# Patient Record
Sex: Female | Born: 1990 | Race: White | Hispanic: No | Marital: Single
Health system: Midwestern US, Community
[De-identification: ages and names within clinical notes are randomized; demographics above are authoritative.]

## PROBLEM LIST (undated history)

## (undated) DIAGNOSIS — Z113 Encounter for screening for infections with a predominantly sexual mode of transmission: Secondary | ICD-10-CM

---

## 2010-08-14 ENCOUNTER — Emergency Department (HOSPITAL_COMMUNITY)
Admission: EM | Admit: 2010-08-14 | Discharge: 2010-08-14 | Disposition: A | Discharge status: Home / Self Care | Payer: 59 | Attending: Emergency Medicine | Admitting: Emergency Medicine

## 2010-08-14 ENCOUNTER — Emergency Department (HOSPITAL_COMMUNITY): Payer: 59

## 2010-08-14 DIAGNOSIS — X500XXA Overexertion from strenuous movement or load, initial encounter: Secondary | ICD-10-CM | POA: Insufficient documentation

## 2010-08-14 DIAGNOSIS — S93409A Sprain of unspecified ligament of unspecified ankle, initial encounter: Secondary | ICD-10-CM | POA: Insufficient documentation

## 2010-08-14 DIAGNOSIS — Y92009 Unspecified place in unspecified non-institutional (private) residence as the place of occurrence of the external cause: Secondary | ICD-10-CM | POA: Insufficient documentation

## 2010-08-14 DIAGNOSIS — M25579 Pain in unspecified ankle and joints of unspecified foot: Secondary | ICD-10-CM | POA: Insufficient documentation

## 2012-01-29 IMAGING — CR DG ANKLE COMPLETE 3+V*R*
3 series · 3 of 3 positions shown · non-contrast
Comparison: None

CLINICAL DATA: Trauma.  Pain.

RIGHT ANKLE - COMPLETE 3+ VIEW

[t ankle joint ap right]
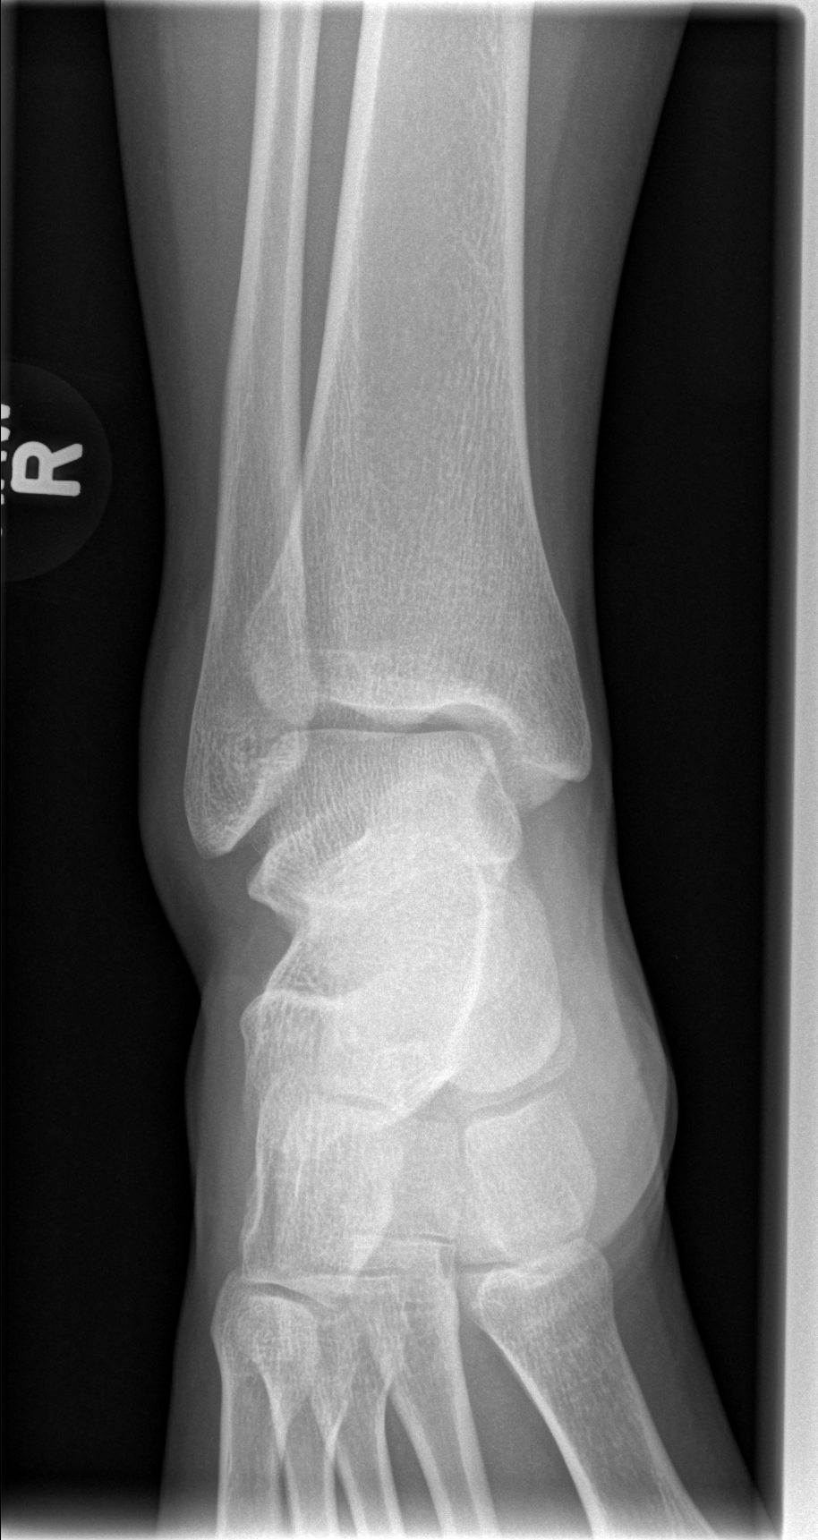

[t ankle joint oblique right]
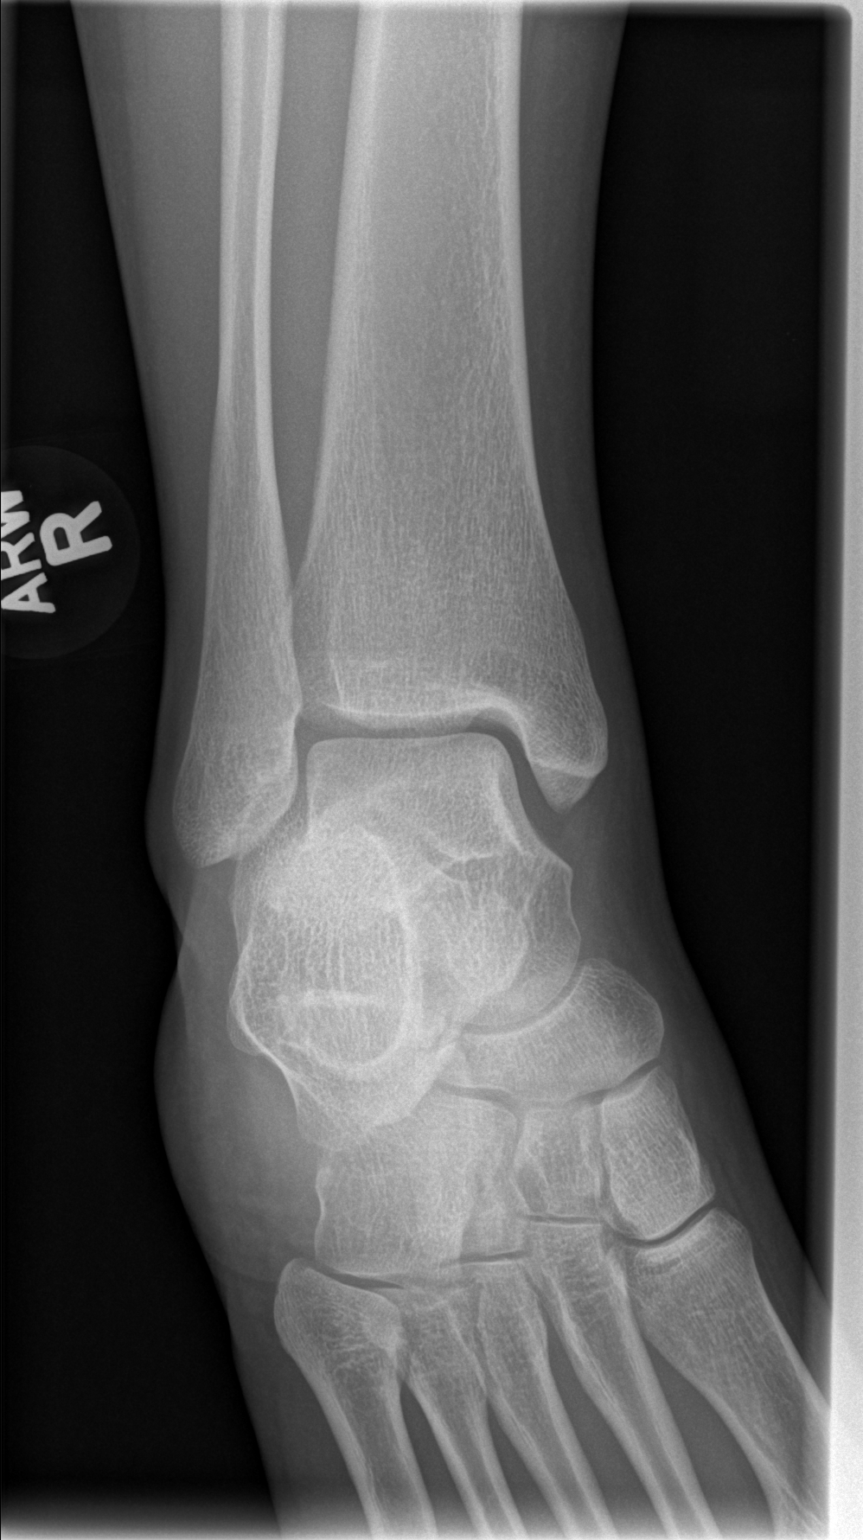

[t ankle joint lat right]
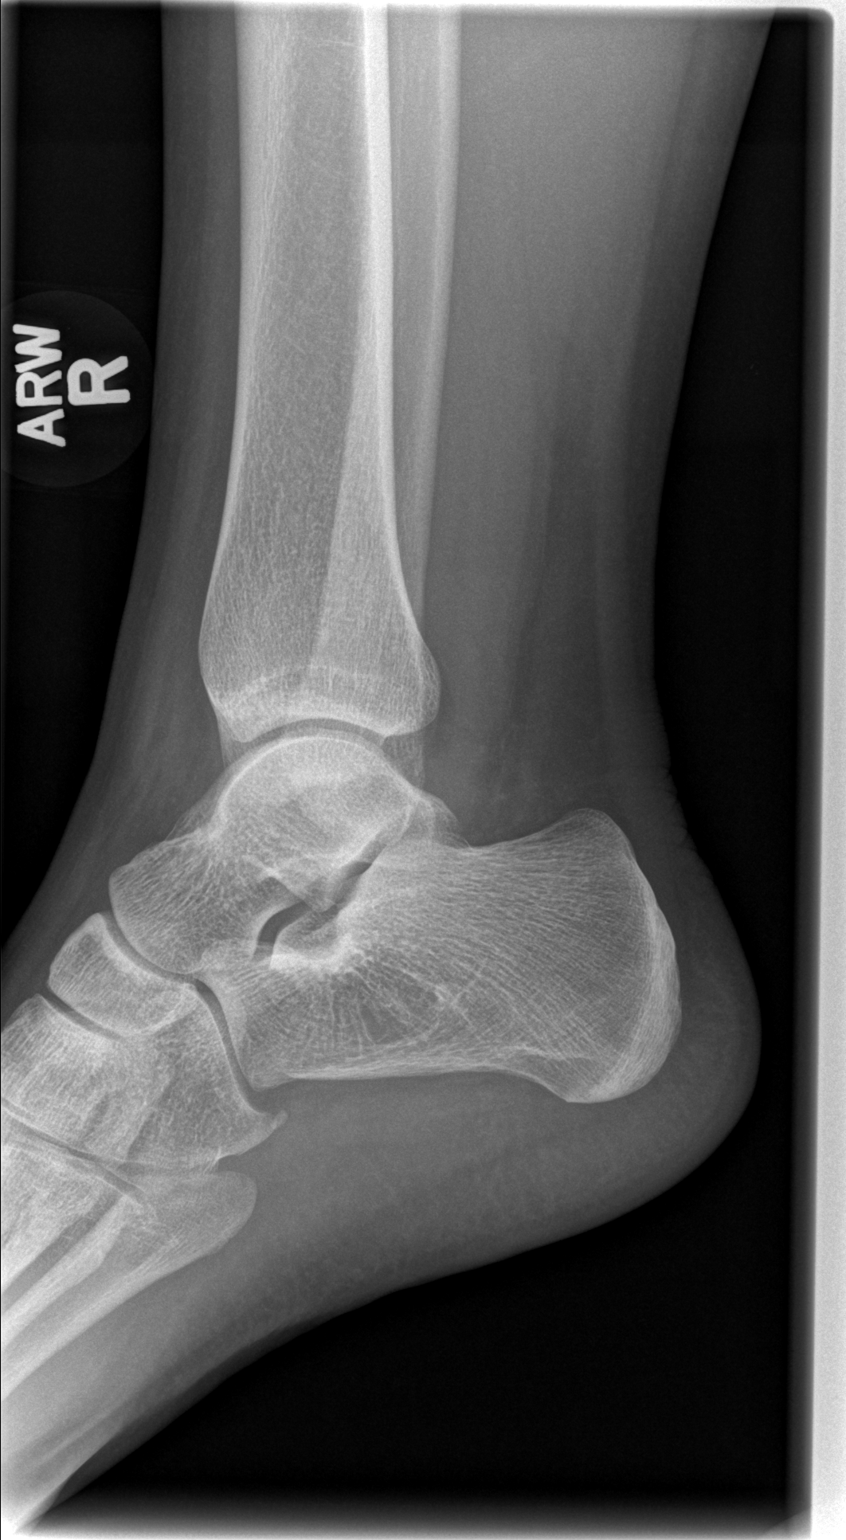

[3 of 3 positions shown; findings below may reference images not displayed]

FINDINGS: There is moderate soft tissue swelling overlying the
lateral ankle.

No underlying fractures or dislocations are identified.

There are no radio-opaque foreign bodies or soft tissue
calcifications identified.
IMPRESSION: 1.  No acute bony abnormalities.
2.  Lateral soft tissue swelling.

## 2015-01-13 ENCOUNTER — Other Ambulatory Visit: Payer: Self-pay

## 2015-01-13 MED ORDER — OMALIZUMAB 150 MG ~~LOC~~ SOLR: 300.0000 mg | SUBCUTANEOUS | Status: AC

## 2015-01-14 DIAGNOSIS — J3089 Other allergic rhinitis: Secondary | ICD-10-CM

## 2015-01-14 DIAGNOSIS — H1010 Acute atopic conjunctivitis, unspecified eye: Secondary | ICD-10-CM

## 2015-01-14 DIAGNOSIS — L508 Other urticaria: Secondary | ICD-10-CM | POA: Insufficient documentation

## 2015-01-14 DIAGNOSIS — J302 Other seasonal allergic rhinitis: Secondary | ICD-10-CM

## 2015-01-14 DIAGNOSIS — Z91018 Allergy to other foods: Secondary | ICD-10-CM

## 2015-01-14 DIAGNOSIS — Z87898 Personal history of other specified conditions: Secondary | ICD-10-CM | POA: Insufficient documentation

## 2015-02-02 ENCOUNTER — Ambulatory Visit (INDEPENDENT_AMBULATORY_CARE_PROVIDER_SITE_OTHER): Payer: BLUE CROSS/BLUE SHIELD

## 2015-02-02 DIAGNOSIS — J309 Allergic rhinitis, unspecified: Secondary | ICD-10-CM

## 2015-02-09 ENCOUNTER — Ambulatory Visit (INDEPENDENT_AMBULATORY_CARE_PROVIDER_SITE_OTHER): Payer: BLUE CROSS/BLUE SHIELD | Admitting: Neurology

## 2015-02-09 DIAGNOSIS — J309 Allergic rhinitis, unspecified: Secondary | ICD-10-CM | POA: Diagnosis not present

## 2015-02-21 ENCOUNTER — Ambulatory Visit (INDEPENDENT_AMBULATORY_CARE_PROVIDER_SITE_OTHER): Payer: BLUE CROSS/BLUE SHIELD

## 2015-02-21 DIAGNOSIS — J309 Allergic rhinitis, unspecified: Secondary | ICD-10-CM

## 2015-03-07 ENCOUNTER — Ambulatory Visit (INDEPENDENT_AMBULATORY_CARE_PROVIDER_SITE_OTHER): Payer: BLUE CROSS/BLUE SHIELD

## 2015-03-07 DIAGNOSIS — J309 Allergic rhinitis, unspecified: Secondary | ICD-10-CM | POA: Diagnosis not present

## 2015-03-14 ENCOUNTER — Ambulatory Visit (INDEPENDENT_AMBULATORY_CARE_PROVIDER_SITE_OTHER): Payer: BLUE CROSS/BLUE SHIELD | Admitting: *Deleted

## 2015-03-14 DIAGNOSIS — J309 Allergic rhinitis, unspecified: Secondary | ICD-10-CM

## 2015-03-21 ENCOUNTER — Ambulatory Visit (INDEPENDENT_AMBULATORY_CARE_PROVIDER_SITE_OTHER): Payer: BLUE CROSS/BLUE SHIELD

## 2015-03-21 DIAGNOSIS — J309 Allergic rhinitis, unspecified: Secondary | ICD-10-CM

## 2015-03-28 ENCOUNTER — Ambulatory Visit (INDEPENDENT_AMBULATORY_CARE_PROVIDER_SITE_OTHER): Payer: BLUE CROSS/BLUE SHIELD

## 2015-03-28 DIAGNOSIS — J309 Allergic rhinitis, unspecified: Secondary | ICD-10-CM | POA: Diagnosis not present

## 2015-04-04 ENCOUNTER — Ambulatory Visit (INDEPENDENT_AMBULATORY_CARE_PROVIDER_SITE_OTHER): Payer: BLUE CROSS/BLUE SHIELD | Admitting: *Deleted

## 2015-04-04 DIAGNOSIS — J309 Allergic rhinitis, unspecified: Secondary | ICD-10-CM

## 2015-04-05 ENCOUNTER — Telehealth: Payer: Self-pay | Admitting: Allergy and Immunology

## 2015-04-05 NOTE — Telephone Encounter (Signed)
Pt came in yesterday, 04/03/25, for an injection. Today her arm is very swollen. "Under her bicep is red and raised, the are is sensitive to touch. This is the Second time this reaction has happened. But Not Consecutive pls advise

## 2015-04-05 NOTE — Telephone Encounter (Signed)
Please ask the patient to ice the local reaction, take an antihistamine, and take an anti-inflammatory (ibuprofen or naproxen).  In addition, we will decrease the dose by 0.2cc, hold for 2 rounds of injections, then resume buildup in 0.05 cc increments. Thanks.

## 2015-04-05 NOTE — Telephone Encounter (Signed)
Called and left voicemail for patient to return phone call

## 2015-04-06 DIAGNOSIS — J301 Allergic rhinitis due to pollen: Secondary | ICD-10-CM | POA: Diagnosis not present

## 2015-04-06 NOTE — Telephone Encounter (Signed)
I left message for patient to call the office.

## 2015-04-07 DIAGNOSIS — J3089 Other allergic rhinitis: Secondary | ICD-10-CM | POA: Diagnosis not present

## 2015-04-07 NOTE — Telephone Encounter (Signed)
Notified patient and patient will continue injection and let us know if she has any further reactions.

## 2015-04-07 NOTE — Telephone Encounter (Signed)
I left message to call office.

## 2015-04-13 ENCOUNTER — Ambulatory Visit (INDEPENDENT_AMBULATORY_CARE_PROVIDER_SITE_OTHER): Payer: BLUE CROSS/BLUE SHIELD

## 2015-04-13 DIAGNOSIS — J309 Allergic rhinitis, unspecified: Secondary | ICD-10-CM

## 2015-04-18 ENCOUNTER — Ambulatory Visit (INDEPENDENT_AMBULATORY_CARE_PROVIDER_SITE_OTHER): Payer: BLUE CROSS/BLUE SHIELD

## 2015-04-18 DIAGNOSIS — J309 Allergic rhinitis, unspecified: Secondary | ICD-10-CM | POA: Diagnosis not present

## 2015-04-25 ENCOUNTER — Ambulatory Visit (INDEPENDENT_AMBULATORY_CARE_PROVIDER_SITE_OTHER): Payer: BLUE CROSS/BLUE SHIELD

## 2015-04-25 DIAGNOSIS — J309 Allergic rhinitis, unspecified: Secondary | ICD-10-CM

## 2015-05-04 ENCOUNTER — Ambulatory Visit (INDEPENDENT_AMBULATORY_CARE_PROVIDER_SITE_OTHER): Payer: BLUE CROSS/BLUE SHIELD

## 2015-05-04 DIAGNOSIS — J309 Allergic rhinitis, unspecified: Secondary | ICD-10-CM

## 2015-05-16 ENCOUNTER — Ambulatory Visit (INDEPENDENT_AMBULATORY_CARE_PROVIDER_SITE_OTHER): Payer: BLUE CROSS/BLUE SHIELD

## 2015-05-16 DIAGNOSIS — J309 Allergic rhinitis, unspecified: Secondary | ICD-10-CM

## 2015-05-23 ENCOUNTER — Ambulatory Visit (INDEPENDENT_AMBULATORY_CARE_PROVIDER_SITE_OTHER): Payer: BLUE CROSS/BLUE SHIELD

## 2015-05-23 DIAGNOSIS — J309 Allergic rhinitis, unspecified: Secondary | ICD-10-CM | POA: Diagnosis not present

## 2015-05-30 ENCOUNTER — Ambulatory Visit (INDEPENDENT_AMBULATORY_CARE_PROVIDER_SITE_OTHER): Payer: BLUE CROSS/BLUE SHIELD

## 2015-05-30 DIAGNOSIS — J309 Allergic rhinitis, unspecified: Secondary | ICD-10-CM | POA: Diagnosis not present

## 2015-06-06 ENCOUNTER — Ambulatory Visit (INDEPENDENT_AMBULATORY_CARE_PROVIDER_SITE_OTHER): Payer: BLUE CROSS/BLUE SHIELD

## 2015-06-06 DIAGNOSIS — J309 Allergic rhinitis, unspecified: Secondary | ICD-10-CM | POA: Diagnosis not present

## 2015-06-15 ENCOUNTER — Ambulatory Visit (INDEPENDENT_AMBULATORY_CARE_PROVIDER_SITE_OTHER): Payer: BLUE CROSS/BLUE SHIELD

## 2015-06-15 DIAGNOSIS — J309 Allergic rhinitis, unspecified: Secondary | ICD-10-CM

## 2015-06-20 ENCOUNTER — Ambulatory Visit (INDEPENDENT_AMBULATORY_CARE_PROVIDER_SITE_OTHER): Payer: BLUE CROSS/BLUE SHIELD

## 2015-06-20 DIAGNOSIS — J309 Allergic rhinitis, unspecified: Secondary | ICD-10-CM

## 2015-06-27 ENCOUNTER — Ambulatory Visit (INDEPENDENT_AMBULATORY_CARE_PROVIDER_SITE_OTHER): Payer: BLUE CROSS/BLUE SHIELD | Admitting: *Deleted

## 2015-06-27 DIAGNOSIS — J309 Allergic rhinitis, unspecified: Secondary | ICD-10-CM

## 2015-07-04 ENCOUNTER — Ambulatory Visit (INDEPENDENT_AMBULATORY_CARE_PROVIDER_SITE_OTHER): Payer: BLUE CROSS/BLUE SHIELD

## 2015-07-04 DIAGNOSIS — J309 Allergic rhinitis, unspecified: Secondary | ICD-10-CM

## 2015-07-11 ENCOUNTER — Ambulatory Visit (INDEPENDENT_AMBULATORY_CARE_PROVIDER_SITE_OTHER): Payer: BLUE CROSS/BLUE SHIELD

## 2015-07-11 DIAGNOSIS — J309 Allergic rhinitis, unspecified: Secondary | ICD-10-CM | POA: Diagnosis not present

## 2015-07-18 ENCOUNTER — Ambulatory Visit (INDEPENDENT_AMBULATORY_CARE_PROVIDER_SITE_OTHER): Payer: BLUE CROSS/BLUE SHIELD | Admitting: *Deleted

## 2015-07-18 DIAGNOSIS — J309 Allergic rhinitis, unspecified: Secondary | ICD-10-CM | POA: Diagnosis not present

## 2015-07-25 ENCOUNTER — Ambulatory Visit (INDEPENDENT_AMBULATORY_CARE_PROVIDER_SITE_OTHER): Payer: BLUE CROSS/BLUE SHIELD

## 2015-07-25 DIAGNOSIS — J309 Allergic rhinitis, unspecified: Secondary | ICD-10-CM

## 2015-08-01 ENCOUNTER — Ambulatory Visit (INDEPENDENT_AMBULATORY_CARE_PROVIDER_SITE_OTHER): Payer: BLUE CROSS/BLUE SHIELD

## 2015-08-01 DIAGNOSIS — J309 Allergic rhinitis, unspecified: Secondary | ICD-10-CM

## 2015-08-08 ENCOUNTER — Ambulatory Visit (INDEPENDENT_AMBULATORY_CARE_PROVIDER_SITE_OTHER): Payer: BLUE CROSS/BLUE SHIELD

## 2015-08-08 DIAGNOSIS — J309 Allergic rhinitis, unspecified: Secondary | ICD-10-CM | POA: Diagnosis not present

## 2015-08-15 ENCOUNTER — Ambulatory Visit (INDEPENDENT_AMBULATORY_CARE_PROVIDER_SITE_OTHER): Payer: BLUE CROSS/BLUE SHIELD

## 2015-08-15 DIAGNOSIS — J309 Allergic rhinitis, unspecified: Secondary | ICD-10-CM

## 2015-08-22 ENCOUNTER — Ambulatory Visit (INDEPENDENT_AMBULATORY_CARE_PROVIDER_SITE_OTHER): Payer: BLUE CROSS/BLUE SHIELD

## 2015-08-22 DIAGNOSIS — J309 Allergic rhinitis, unspecified: Secondary | ICD-10-CM | POA: Diagnosis not present

## 2015-08-29 ENCOUNTER — Ambulatory Visit (INDEPENDENT_AMBULATORY_CARE_PROVIDER_SITE_OTHER): Payer: BLUE CROSS/BLUE SHIELD

## 2015-08-29 DIAGNOSIS — J309 Allergic rhinitis, unspecified: Secondary | ICD-10-CM | POA: Diagnosis not present

## 2015-09-05 ENCOUNTER — Ambulatory Visit (INDEPENDENT_AMBULATORY_CARE_PROVIDER_SITE_OTHER): Payer: BLUE CROSS/BLUE SHIELD

## 2015-09-05 DIAGNOSIS — J309 Allergic rhinitis, unspecified: Secondary | ICD-10-CM

## 2015-09-14 ENCOUNTER — Ambulatory Visit (INDEPENDENT_AMBULATORY_CARE_PROVIDER_SITE_OTHER): Payer: BLUE CROSS/BLUE SHIELD

## 2015-09-14 DIAGNOSIS — J309 Allergic rhinitis, unspecified: Secondary | ICD-10-CM

## 2015-09-20 DIAGNOSIS — J301 Allergic rhinitis due to pollen: Secondary | ICD-10-CM | POA: Diagnosis not present

## 2015-09-21 DIAGNOSIS — J3089 Other allergic rhinitis: Secondary | ICD-10-CM | POA: Diagnosis not present

## 2015-09-26 ENCOUNTER — Ambulatory Visit (INDEPENDENT_AMBULATORY_CARE_PROVIDER_SITE_OTHER): Payer: BLUE CROSS/BLUE SHIELD

## 2015-09-26 DIAGNOSIS — J309 Allergic rhinitis, unspecified: Secondary | ICD-10-CM

## 2015-10-05 ENCOUNTER — Ambulatory Visit (INDEPENDENT_AMBULATORY_CARE_PROVIDER_SITE_OTHER): Payer: BLUE CROSS/BLUE SHIELD

## 2015-10-05 DIAGNOSIS — J309 Allergic rhinitis, unspecified: Secondary | ICD-10-CM | POA: Diagnosis not present

## 2015-10-10 ENCOUNTER — Ambulatory Visit (INDEPENDENT_AMBULATORY_CARE_PROVIDER_SITE_OTHER): Payer: BLUE CROSS/BLUE SHIELD | Admitting: *Deleted

## 2015-10-10 DIAGNOSIS — J309 Allergic rhinitis, unspecified: Secondary | ICD-10-CM | POA: Diagnosis not present

## 2015-10-19 ENCOUNTER — Ambulatory Visit (INDEPENDENT_AMBULATORY_CARE_PROVIDER_SITE_OTHER): Payer: BLUE CROSS/BLUE SHIELD

## 2015-10-19 DIAGNOSIS — J309 Allergic rhinitis, unspecified: Secondary | ICD-10-CM

## 2015-10-24 ENCOUNTER — Ambulatory Visit (INDEPENDENT_AMBULATORY_CARE_PROVIDER_SITE_OTHER): Payer: BLUE CROSS/BLUE SHIELD

## 2015-10-24 DIAGNOSIS — J309 Allergic rhinitis, unspecified: Secondary | ICD-10-CM

## 2015-10-31 ENCOUNTER — Ambulatory Visit (INDEPENDENT_AMBULATORY_CARE_PROVIDER_SITE_OTHER): Payer: BLUE CROSS/BLUE SHIELD

## 2015-10-31 DIAGNOSIS — J309 Allergic rhinitis, unspecified: Secondary | ICD-10-CM | POA: Diagnosis not present

## 2015-11-14 ENCOUNTER — Ambulatory Visit (INDEPENDENT_AMBULATORY_CARE_PROVIDER_SITE_OTHER): Payer: BLUE CROSS/BLUE SHIELD

## 2015-11-14 DIAGNOSIS — J309 Allergic rhinitis, unspecified: Secondary | ICD-10-CM

## 2015-11-21 ENCOUNTER — Ambulatory Visit (INDEPENDENT_AMBULATORY_CARE_PROVIDER_SITE_OTHER): Payer: BLUE CROSS/BLUE SHIELD | Admitting: *Deleted

## 2015-11-21 DIAGNOSIS — J309 Allergic rhinitis, unspecified: Secondary | ICD-10-CM | POA: Diagnosis not present

## 2015-11-28 ENCOUNTER — Ambulatory Visit (INDEPENDENT_AMBULATORY_CARE_PROVIDER_SITE_OTHER): Payer: BLUE CROSS/BLUE SHIELD | Admitting: *Deleted

## 2015-11-28 DIAGNOSIS — J309 Allergic rhinitis, unspecified: Secondary | ICD-10-CM

## 2015-12-05 ENCOUNTER — Ambulatory Visit (INDEPENDENT_AMBULATORY_CARE_PROVIDER_SITE_OTHER): Payer: BLUE CROSS/BLUE SHIELD

## 2015-12-05 DIAGNOSIS — J309 Allergic rhinitis, unspecified: Secondary | ICD-10-CM

## 2015-12-12 ENCOUNTER — Ambulatory Visit (INDEPENDENT_AMBULATORY_CARE_PROVIDER_SITE_OTHER): Payer: BLUE CROSS/BLUE SHIELD | Admitting: *Deleted

## 2015-12-12 DIAGNOSIS — J309 Allergic rhinitis, unspecified: Secondary | ICD-10-CM

## 2015-12-19 ENCOUNTER — Ambulatory Visit (INDEPENDENT_AMBULATORY_CARE_PROVIDER_SITE_OTHER): Payer: BLUE CROSS/BLUE SHIELD | Admitting: *Deleted

## 2015-12-19 DIAGNOSIS — J309 Allergic rhinitis, unspecified: Secondary | ICD-10-CM | POA: Diagnosis not present

## 2015-12-20 DIAGNOSIS — J301 Allergic rhinitis due to pollen: Secondary | ICD-10-CM | POA: Diagnosis not present

## 2015-12-21 DIAGNOSIS — J3089 Other allergic rhinitis: Secondary | ICD-10-CM | POA: Diagnosis not present

## 2015-12-26 ENCOUNTER — Ambulatory Visit (INDEPENDENT_AMBULATORY_CARE_PROVIDER_SITE_OTHER): Payer: BLUE CROSS/BLUE SHIELD | Admitting: *Deleted

## 2015-12-26 DIAGNOSIS — J309 Allergic rhinitis, unspecified: Secondary | ICD-10-CM | POA: Diagnosis not present

## 2016-01-02 ENCOUNTER — Ambulatory Visit (INDEPENDENT_AMBULATORY_CARE_PROVIDER_SITE_OTHER): Payer: BLUE CROSS/BLUE SHIELD | Admitting: *Deleted

## 2016-01-02 DIAGNOSIS — J309 Allergic rhinitis, unspecified: Secondary | ICD-10-CM | POA: Diagnosis not present

## 2016-01-09 ENCOUNTER — Ambulatory Visit (INDEPENDENT_AMBULATORY_CARE_PROVIDER_SITE_OTHER): Payer: BLUE CROSS/BLUE SHIELD

## 2016-01-09 DIAGNOSIS — J309 Allergic rhinitis, unspecified: Secondary | ICD-10-CM | POA: Diagnosis not present

## 2016-01-16 ENCOUNTER — Ambulatory Visit (INDEPENDENT_AMBULATORY_CARE_PROVIDER_SITE_OTHER): Payer: BLUE CROSS/BLUE SHIELD | Admitting: *Deleted

## 2016-01-16 DIAGNOSIS — J309 Allergic rhinitis, unspecified: Secondary | ICD-10-CM | POA: Diagnosis not present

## 2016-01-30 ENCOUNTER — Ambulatory Visit (INDEPENDENT_AMBULATORY_CARE_PROVIDER_SITE_OTHER): Payer: BLUE CROSS/BLUE SHIELD

## 2016-01-30 DIAGNOSIS — J309 Allergic rhinitis, unspecified: Secondary | ICD-10-CM | POA: Diagnosis not present

## 2016-02-06 ENCOUNTER — Ambulatory Visit (INDEPENDENT_AMBULATORY_CARE_PROVIDER_SITE_OTHER): Payer: BLUE CROSS/BLUE SHIELD

## 2016-02-06 DIAGNOSIS — J309 Allergic rhinitis, unspecified: Secondary | ICD-10-CM

## 2016-02-20 ENCOUNTER — Ambulatory Visit (INDEPENDENT_AMBULATORY_CARE_PROVIDER_SITE_OTHER): Payer: BLUE CROSS/BLUE SHIELD | Admitting: *Deleted

## 2016-02-20 DIAGNOSIS — J309 Allergic rhinitis, unspecified: Secondary | ICD-10-CM

## 2016-03-12 ENCOUNTER — Ambulatory Visit (INDEPENDENT_AMBULATORY_CARE_PROVIDER_SITE_OTHER): Payer: BLUE CROSS/BLUE SHIELD

## 2016-03-12 DIAGNOSIS — J309 Allergic rhinitis, unspecified: Secondary | ICD-10-CM | POA: Diagnosis not present

## 2016-03-24 DIAGNOSIS — J3089 Other allergic rhinitis: Secondary | ICD-10-CM | POA: Diagnosis not present

## 2016-03-25 DIAGNOSIS — J301 Allergic rhinitis due to pollen: Secondary | ICD-10-CM | POA: Diagnosis not present

## 2016-03-26 ENCOUNTER — Ambulatory Visit (INDEPENDENT_AMBULATORY_CARE_PROVIDER_SITE_OTHER): Payer: BLUE CROSS/BLUE SHIELD | Admitting: *Deleted

## 2016-03-26 DIAGNOSIS — J309 Allergic rhinitis, unspecified: Secondary | ICD-10-CM

## 2016-04-09 ENCOUNTER — Ambulatory Visit (INDEPENDENT_AMBULATORY_CARE_PROVIDER_SITE_OTHER): Payer: BLUE CROSS/BLUE SHIELD | Admitting: *Deleted

## 2016-04-09 DIAGNOSIS — J309 Allergic rhinitis, unspecified: Secondary | ICD-10-CM | POA: Diagnosis not present

## 2016-04-25 ENCOUNTER — Ambulatory Visit (INDEPENDENT_AMBULATORY_CARE_PROVIDER_SITE_OTHER): Payer: BLUE CROSS/BLUE SHIELD | Admitting: *Deleted

## 2016-04-25 DIAGNOSIS — J309 Allergic rhinitis, unspecified: Secondary | ICD-10-CM | POA: Diagnosis not present

## 2016-05-07 ENCOUNTER — Ambulatory Visit (INDEPENDENT_AMBULATORY_CARE_PROVIDER_SITE_OTHER): Payer: BLUE CROSS/BLUE SHIELD | Admitting: *Deleted

## 2016-05-07 DIAGNOSIS — J309 Allergic rhinitis, unspecified: Secondary | ICD-10-CM

## 2016-05-14 ENCOUNTER — Ambulatory Visit (INDEPENDENT_AMBULATORY_CARE_PROVIDER_SITE_OTHER): Payer: BLUE CROSS/BLUE SHIELD | Admitting: *Deleted

## 2016-05-14 DIAGNOSIS — J309 Allergic rhinitis, unspecified: Secondary | ICD-10-CM

## 2016-05-28 ENCOUNTER — Ambulatory Visit (INDEPENDENT_AMBULATORY_CARE_PROVIDER_SITE_OTHER): Payer: BLUE CROSS/BLUE SHIELD | Admitting: *Deleted

## 2016-05-28 DIAGNOSIS — J309 Allergic rhinitis, unspecified: Secondary | ICD-10-CM | POA: Diagnosis not present

## 2016-06-04 ENCOUNTER — Ambulatory Visit (INDEPENDENT_AMBULATORY_CARE_PROVIDER_SITE_OTHER): Payer: BLUE CROSS/BLUE SHIELD

## 2016-06-04 DIAGNOSIS — J309 Allergic rhinitis, unspecified: Secondary | ICD-10-CM

## 2016-06-13 ENCOUNTER — Ambulatory Visit (INDEPENDENT_AMBULATORY_CARE_PROVIDER_SITE_OTHER): Payer: BLUE CROSS/BLUE SHIELD

## 2016-06-13 DIAGNOSIS — J309 Allergic rhinitis, unspecified: Secondary | ICD-10-CM

## 2016-06-18 NOTE — Addendum Note (Signed)
Addended by: Berna BueWHITAKER, Zahriyah Joo L on: 06/18/2016 11:25 AM   Modules accepted: Orders

## 2016-06-20 ENCOUNTER — Ambulatory Visit (INDEPENDENT_AMBULATORY_CARE_PROVIDER_SITE_OTHER): Payer: BLUE CROSS/BLUE SHIELD

## 2016-06-20 DIAGNOSIS — J309 Allergic rhinitis, unspecified: Secondary | ICD-10-CM

## 2016-07-11 ENCOUNTER — Ambulatory Visit (INDEPENDENT_AMBULATORY_CARE_PROVIDER_SITE_OTHER): Payer: BLUE CROSS/BLUE SHIELD

## 2016-07-11 DIAGNOSIS — J309 Allergic rhinitis, unspecified: Secondary | ICD-10-CM | POA: Diagnosis not present

## 2016-07-23 ENCOUNTER — Ambulatory Visit (INDEPENDENT_AMBULATORY_CARE_PROVIDER_SITE_OTHER): Payer: BLUE CROSS/BLUE SHIELD | Admitting: *Deleted

## 2016-07-23 DIAGNOSIS — J309 Allergic rhinitis, unspecified: Secondary | ICD-10-CM

## 2016-08-08 DIAGNOSIS — J301 Allergic rhinitis due to pollen: Secondary | ICD-10-CM | POA: Diagnosis not present

## 2016-08-22 ENCOUNTER — Ambulatory Visit (INDEPENDENT_AMBULATORY_CARE_PROVIDER_SITE_OTHER): Payer: BLUE CROSS/BLUE SHIELD

## 2016-08-22 DIAGNOSIS — J309 Allergic rhinitis, unspecified: Secondary | ICD-10-CM

## 2016-09-03 ENCOUNTER — Ambulatory Visit (INDEPENDENT_AMBULATORY_CARE_PROVIDER_SITE_OTHER): Payer: BLUE CROSS/BLUE SHIELD | Admitting: *Deleted

## 2016-09-03 DIAGNOSIS — J309 Allergic rhinitis, unspecified: Secondary | ICD-10-CM | POA: Diagnosis not present

## 2016-09-19 ENCOUNTER — Ambulatory Visit (INDEPENDENT_AMBULATORY_CARE_PROVIDER_SITE_OTHER): Payer: BLUE CROSS/BLUE SHIELD | Admitting: *Deleted

## 2016-09-19 DIAGNOSIS — J309 Allergic rhinitis, unspecified: Secondary | ICD-10-CM

## 2016-09-27 ENCOUNTER — Telehealth: Payer: Self-pay | Admitting: Allergy and Immunology

## 2016-09-27 NOTE — Telephone Encounter (Signed)
Patient called and said her insurance changed and she keeps forgetting to update it when she comes in for her shots. She said she keeps getting a bill from her previous [employer] and it keeps going up. She had [employer]. She now has [employer] [place] [place]. I entered it and verified it.

## 2016-10-01 ENCOUNTER — Ambulatory Visit (INDEPENDENT_AMBULATORY_CARE_PROVIDER_SITE_OTHER): Payer: BLUE CROSS/BLUE SHIELD | Admitting: *Deleted

## 2016-10-01 DIAGNOSIS — J309 Allergic rhinitis, unspecified: Secondary | ICD-10-CM | POA: Diagnosis not present

## 2016-10-01 NOTE — Telephone Encounter (Signed)
Left message that I resubmitted all 2018 claims with corrected ins info - kt

## 2016-10-02 ENCOUNTER — Telehealth: Payer: Self-pay | Admitting: Allergy and Immunology

## 2016-10-02 ENCOUNTER — Telehealth: Payer: Self-pay | Admitting: Allergy

## 2016-10-02 NOTE — Telephone Encounter (Signed)
Patient called back and advised me that she did not bring a spacer back to the office.

## 2016-10-02 NOTE — Telephone Encounter (Signed)
Left message for patient to call back. She will need to contact [employer] customer service.

## 2016-10-02 NOTE — Telephone Encounter (Signed)
Patient called Friday, 09-27-16, and said Dr. Delorse LekPadgett had prescribed an inhaler for her that required a spacer. A nurse gave her the spacer and said they would file it with her insurance. Later that week, Dr. Delorse LekPadgett changed the inhaler and this one didn't need a spacer. She returned it to the office and was told they would get it taken off of her insurance. She keeps getting a bill for the spacer and would like to know if it has been fixed. I sent Olegario MessierKathy a message about this and she said they don't deal with [employer] billing.

## 2016-10-10 ENCOUNTER — Ambulatory Visit (INDEPENDENT_AMBULATORY_CARE_PROVIDER_SITE_OTHER): Payer: BLUE CROSS/BLUE SHIELD | Admitting: *Deleted

## 2016-10-10 DIAGNOSIS — J309 Allergic rhinitis, unspecified: Secondary | ICD-10-CM

## 2016-10-17 ENCOUNTER — Ambulatory Visit (INDEPENDENT_AMBULATORY_CARE_PROVIDER_SITE_OTHER): Payer: BLUE CROSS/BLUE SHIELD | Admitting: *Deleted

## 2016-10-17 DIAGNOSIS — J309 Allergic rhinitis, unspecified: Secondary | ICD-10-CM

## 2016-10-24 ENCOUNTER — Ambulatory Visit (INDEPENDENT_AMBULATORY_CARE_PROVIDER_SITE_OTHER): Payer: BLUE CROSS/BLUE SHIELD | Admitting: *Deleted

## 2016-10-24 DIAGNOSIS — J309 Allergic rhinitis, unspecified: Secondary | ICD-10-CM | POA: Diagnosis not present

## 2016-10-31 ENCOUNTER — Ambulatory Visit (INDEPENDENT_AMBULATORY_CARE_PROVIDER_SITE_OTHER): Payer: BLUE CROSS/BLUE SHIELD | Admitting: *Deleted

## 2016-10-31 DIAGNOSIS — J309 Allergic rhinitis, unspecified: Secondary | ICD-10-CM

## 2016-11-12 ENCOUNTER — Telehealth: Payer: Self-pay | Admitting: *Deleted

## 2016-11-12 ENCOUNTER — Ambulatory Visit (INDEPENDENT_AMBULATORY_CARE_PROVIDER_SITE_OTHER): Payer: BLUE CROSS/BLUE SHIELD | Admitting: *Deleted

## 2016-11-12 DIAGNOSIS — J309 Allergic rhinitis, unspecified: Secondary | ICD-10-CM

## 2016-11-12 NOTE — Telephone Encounter (Signed)
Patient would like a return call regarding her account balance.

## 2016-11-13 NOTE — Telephone Encounter (Signed)
Talked to pt about her balance - 2017 dates had been sent to [employer] and she did not realize that -she will start paying $100/mo beginning 11-21-16 and I will have A1 removed - kt

## 2016-11-26 ENCOUNTER — Ambulatory Visit (INDEPENDENT_AMBULATORY_CARE_PROVIDER_SITE_OTHER): Payer: BLUE CROSS/BLUE SHIELD | Admitting: *Deleted

## 2016-11-26 DIAGNOSIS — J309 Allergic rhinitis, unspecified: Secondary | ICD-10-CM

## 2016-12-10 ENCOUNTER — Ambulatory Visit (INDEPENDENT_AMBULATORY_CARE_PROVIDER_SITE_OTHER): Payer: BLUE CROSS/BLUE SHIELD | Admitting: *Deleted

## 2016-12-10 DIAGNOSIS — J309 Allergic rhinitis, unspecified: Secondary | ICD-10-CM

## 2017-01-09 NOTE — Progress Notes (Signed)
2 MT VIALS MADE EXP. 01/10/18 

## 2017-02-13 ENCOUNTER — Telehealth: Payer: Self-pay | Admitting: Allergy

## 2017-02-13 NOTE — Telephone Encounter (Signed)
Patient would like to talk to you about her [phone].

## 2017-02-13 NOTE — Telephone Encounter (Signed)
done

## 2017-02-14 NOTE — Telephone Encounter (Signed)
Patient called to verify that we did not charge for the vials since she signed the discontinue form.  I assured her that we had not.  She paid her monthly promised pmt.  Closing ticket

## 2018-04-16 ENCOUNTER — Encounter

## 2018-04-16 ENCOUNTER — Ambulatory Visit: Admit: 2018-04-16 | Discharge: 2018-04-16 | Payer: BLUE CROSS/BLUE SHIELD | Attending: Obstetrics & Gynecology

## 2018-04-16 DIAGNOSIS — Z01419 Encounter for gynecological examination (general) (routine) without abnormal findings: Secondary | ICD-10-CM

## 2018-04-16 MED ORDER — NORETHINDRONE-ETH ESTRADIOL 0.5-35 MG-MCG PO TABS
PACK | Freq: Every day | ORAL | 4 refills | Status: DC
Start: 2018-04-16 — End: 2019-01-06

## 2018-04-16 MED ORDER — VALACYCLOVIR HCL 500 MG PO TABS
500 MG | ORAL_TABLET | Freq: Every day | ORAL | 4 refills | Status: DC
Start: 2018-04-16 — End: 2019-01-06

## 2018-04-16 NOTE — Addendum Note (Signed)
Addended by: Royston BakePALUMBO, Haliey Romberg on: 04/16/2018 11:13 AM     Modules accepted: Orders

## 2018-04-16 NOTE — Progress Notes (Signed)
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    BP 117/74    Pulse 76    Ht 5' 4.5" (1.638 m)    Wt 158 lb (71.7 kg)    BMI 26.70 kg/m??     HPI     No c/o, menses are reg on ocp   Desires full std screen   On valtrex daily suppression   Recently moved from [place]     Review of Systems   Constitutional: Negative for chills and fever.   Respiratory: Negative for chest tightness and shortness of breath.    Cardiovascular: Negative for chest pain and leg swelling.   Gastrointestinal: Negative for abdominal pain and blood in stool.   Genitourinary: Negative for dysuria, hematuria, menstrual problem and pelvic pain.   Musculoskeletal: Negative for back pain and joint swelling.   Skin: Negative for color change and rash.   Neurological: Negative for dizziness and weakness.   Hematological: Does not bruise/bleed easily.   Psychiatric/Behavioral: Negative for behavioral problems and suicidal ideas. The patient is not nervous/anxious.        Objective:   Physical Exam  Constitutional:       General: She is not in acute distress.     Appearance: She is well-developed.   HENT:      Head: Normocephalic and atraumatic.   Neck:      Thyroid: No thyromegaly.      Trachea: No tracheal deviation.   Cardiovascular:      Rate and Rhythm: Normal rate and regular rhythm.   Pulmonary:      Effort: Pulmonary effort is normal.      Breath sounds: Normal breath sounds.   Chest:      Breasts:         Right: No mass, nipple discharge or skin change.         Left: No mass, nipple discharge or skin change.   Abdominal:      General: There is no distension.      Palpations: Abdomen is soft.      Tenderness: There is no tenderness.   Genitourinary:     Labia:         Right: No rash or lesion.         Left: No rash or lesion.       Vagina: Normal. No vaginal discharge.      Cervix: No cervical motion tenderness or friability.      Uterus: Not enlarged and not tender.       Adnexa:         Right: No mass or tenderness.           Left: No mass or tenderness.     Musculoskeletal: Normal range of motion.   Skin:     General: Skin is warm and dry.   Neurological:      Mental Status: She is alert and oriented to person, place, and time.   Psychiatric:         Behavior: Behavior normal.         Assessment:       Diagnosis Orders   1. Women's annual routine gynecological examination  PAP SMEAR   2. Screen for STD (sexually transmitted disease)  C. Trachomatis / N. Gonorrhoeae, DNA         Plan:      F/u prn/1 yr         Georga HackingSusan M  Jamey Reas, MD

## 2018-04-17 LAB — C. TRACHOMATIS / N. GONORRHOEAE, DNA
C. trachomatis DNA: NOT DETECTED
NEISSERIA GONORRHOEAE, DNA: NOT DETECTED

## 2018-04-17 LAB — RPR WITH FTA REFLEX: RPR: NONREACTIVE NA

## 2018-04-17 LAB — HEPATITIS B SURFACE ANTIGEN: Hepatitis B Surface Ag: NOT DETECTED NA

## 2018-04-17 LAB — HIV SCREEN: HIV 1+2 AB+HIV1P24 AG, EIA: NONREACTIVE NA

## 2018-04-23 LAB — PAP SMEAR

## 2018-06-18 NOTE — Telephone Encounter (Signed)
error 

## 2019-01-06 MED ORDER — NORTREL 0.5/35 (28) 0.5-35 MG-MCG PO TABS
PACK | Freq: Every day | ORAL | 1 refills | Status: DC
Start: 2019-01-06 — End: 2019-04-15

## 2019-01-06 MED ORDER — VALACYCLOVIR HCL 500 MG PO TABS
500 MG | ORAL_TABLET | Freq: Every day | ORAL | 1 refills | Status: DC
Start: 2019-01-06 — End: 2019-04-15

## 2019-01-06 NOTE — Telephone Encounter (Signed)
Pt. Requests refill on valtrex and ocp. Annual exam scheduled 12/10.

## 2019-04-15 ENCOUNTER — Ambulatory Visit: Admit: 2019-04-15 | Discharge: 2019-04-15 | Payer: BLUE CROSS/BLUE SHIELD | Attending: Obstetrics & Gynecology

## 2019-04-15 DIAGNOSIS — Z01419 Encounter for gynecological examination (general) (routine) without abnormal findings: Secondary | ICD-10-CM

## 2019-04-15 MED ORDER — METRONIDAZOLE 500 MG PO TABS
500 MG | ORAL_TABLET | Freq: Two times a day (BID) | ORAL | 0 refills | Status: AC
Start: 2019-04-15 — End: 2019-04-22

## 2019-04-15 MED ORDER — NORTREL 0.5/35 (28) 0.5-35 MG-MCG PO TABS
PACK | Freq: Every day | ORAL | 4 refills | Status: AC
Start: 2019-04-15 — End: ?

## 2019-04-15 MED ORDER — VALACYCLOVIR HCL 500 MG PO TABS
500 MG | ORAL_TABLET | Freq: Every day | ORAL | 4 refills | Status: AC
Start: 2019-04-15 — End: ?

## 2019-04-15 MED ORDER — METRONIDAZOLE 0.75 % VA GEL
0.75 % | VAGINAL | 5 refills | Status: AC
Start: 2019-04-15 — End: ?

## 2019-04-15 NOTE — Progress Notes (Signed)
Subjective:      Patient ID: Emily Mayo is a 28 y.o. female.    BP 113/77    Pulse 86    Temp 97.5 ??F (36.4 ??C)    Ht 5' 4.5" (1.638 m)    Wt 170 lb (77.1 kg)    BMI 28.73 kg/m??     HPI     menses are reg on ocp   On qd valtrex suppression   C/o bv - having d/c and odor.   Getting it monthly after menses.      Review of Systems   Constitutional: Negative for chills and fever.   Respiratory: Negative for chest tightness and shortness of breath.    Cardiovascular: Negative for chest pain and leg swelling.   Gastrointestinal: Negative for abdominal pain and blood in stool.   Genitourinary: Negative for dysuria, hematuria, menstrual problem and pelvic pain.   Musculoskeletal: Negative for back pain and joint swelling.   Skin: Negative for color change and rash.   Neurological: Negative for dizziness and weakness.   Hematological: Does not bruise/bleed easily.   Psychiatric/Behavioral: Negative for behavioral problems and suicidal ideas. The patient is not nervous/anxious.        Objective:   Physical Exam  Constitutional:       General: She is not in acute distress.     Appearance: She is well-developed.   HENT:      Head: Normocephalic and atraumatic.   Neck:      Thyroid: No thyromegaly.      Trachea: No tracheal deviation.   Cardiovascular:      Rate and Rhythm: Normal rate and regular rhythm.   Pulmonary:      Effort: Pulmonary effort is normal.      Breath sounds: Normal breath sounds.   Chest:      Breasts:         Right: No mass, nipple discharge or skin change.         Left: No mass, nipple discharge or skin change.   Abdominal:      General: There is no distension.      Palpations: Abdomen is soft.      Tenderness: There is no abdominal tenderness.   Genitourinary:     Labia:         Right: No rash or lesion.         Left: No rash or lesion.       Vagina: Vaginal discharge (yellowish d/c and odor c/w bv ) present.      Cervix: No cervical motion tenderness or friability.      Uterus: Not enlarged and  not tender.       Adnexa:         Right: No mass or tenderness.          Left: No mass or tenderness.     Musculoskeletal: Normal range of motion.   Skin:     General: Skin is warm and dry.   Neurological:      Mental Status: She is alert and oriented to person, place, and time.   Psychiatric:         Behavior: Behavior normal.         Assessment:       Diagnosis Orders   1. Women's annual routine gynecological examination           Plan:       rx flagyl x 1 wk, then metrogel 2x/wk x 6 mos  F/u prn/1 yr         Carlos American, MD

## 2019-05-03 NOTE — Telephone Encounter (Signed)
S-patient states she finished the Metronidazole and started the Metrogel (2x week for 6 months). States after the 2nd dose of the Metrogel her vaginal area is excoriated and burns  B-states had burning and soreness vaginal area after first dose of Metrogel  A-states she cannot tell if any vaginal discharge as the Metrogel is there. She is due for her next dose today and has decided not to use it as it is too painful.  R-Patient would like to know what to do next as she cannot tolerate the Metrogel-states she has many allergies. Pt can be reached either via MY CHART or at [phone]  Advised to put ice in soft towel and put on area to cool it down    Reason for Disposition  ??? Caller has NON-URGENT medication question about med that PCP or specialist prescribed and triager unable to answer question    Answer Assessment - Initial Assessment Questions  .    Protocols used: MEDICATION QUESTION CALL-ADULT-[place]

## 2019-05-04 NOTE — Telephone Encounter (Signed)
D/c metrogel.  Can apply vaseline to vulva bid

## 2019-05-05 NOTE — Telephone Encounter (Signed)
LM for return call. Will send via my chart.

## 2019-09-07 MED ORDER — METRONIDAZOLE 500 MG PO TABS
500 MG | ORAL_TABLET | Freq: Two times a day (BID) | ORAL | 0 refills | Status: AC
Start: 2019-09-07 — End: 2019-09-14

## 2019-09-07 NOTE — Telephone Encounter (Signed)
S: Pt calling [facility] for pelvic pain.   B: symptoms x 3 weeks  A: Pt has white vaginal discharge with mild odor, denies itching. She is having intermittent pelvic pain to center, 4/10, throbbing in nature. Has not taken any medication for it.  Denies fever, back pain or dysuria. Pt states she gets BV frequently and states this is the same and she normally has the pelvic pain with it.   R: Pt declines appointment at this time, requesting metronidazole be called in for her. Allergies reviewed and pharmacy and insurance verified. Pt to call back for worsening symptoms.     Reason for Disposition  ??? Bad smelling vaginal discharge    Protocols used: VAGINAL DISCHARGE-ADULT-[place]

## 2019-09-07 NOTE — Telephone Encounter (Signed)
rx sent.  appt if sx do not resolve

## 2019-09-07 NOTE — Telephone Encounter (Signed)
R: Per note from Dr. Risa Grill, left message for patient to pick up medication from pharmacy and to call back if there were further needs.

## 2020-04-20 ENCOUNTER — Encounter: Attending: Obstetrics & Gynecology
